# Patient Record
Sex: Female | Born: 1970 | Race: White | Hispanic: No | Marital: Married | Smoking: Former smoker
Health system: Southern US, Community
[De-identification: ages and names within clinical notes are randomized; demographics above are authoritative.]

## PROBLEM LIST (undated history)

## (undated) DIAGNOSIS — B019 Varicella without complication: Secondary | ICD-10-CM

## (undated) DIAGNOSIS — E282 Polycystic ovarian syndrome: Secondary | ICD-10-CM

## (undated) DIAGNOSIS — N390 Urinary tract infection, site not specified: Secondary | ICD-10-CM

## (undated) HISTORY — DX: Varicella without complication: B01.9

## (undated) HISTORY — PX: DILATION AND CURETTAGE OF UTERUS: SHX78

## (undated) HISTORY — PX: TUBAL LIGATION: SHX77

---

## 2011-07-24 ENCOUNTER — Inpatient Hospital Stay (HOSPITAL_COMMUNITY)
Admission: AD | Admit: 2011-07-24 | Discharge: 2011-07-24 | Disposition: A | Discharge status: Home / Self Care | Payer: BC Managed Care – PPO | Source: Ambulatory Visit | Attending: Obstetrics and Gynecology | Admitting: Obstetrics and Gynecology

## 2011-07-24 ENCOUNTER — Encounter (HOSPITAL_COMMUNITY): Payer: Self-pay | Admitting: *Deleted

## 2011-07-24 ENCOUNTER — Inpatient Hospital Stay (HOSPITAL_COMMUNITY): Payer: BC Managed Care – PPO

## 2011-07-24 DIAGNOSIS — N949 Unspecified condition associated with female genital organs and menstrual cycle: Secondary | ICD-10-CM | POA: Insufficient documentation

## 2011-07-24 DIAGNOSIS — M545 Low back pain, unspecified: Secondary | ICD-10-CM | POA: Insufficient documentation

## 2011-07-24 DIAGNOSIS — N83202 Unspecified ovarian cyst, left side: Secondary | ICD-10-CM | POA: Diagnosis present

## 2011-07-24 DIAGNOSIS — N83209 Unspecified ovarian cyst, unspecified side: Secondary | ICD-10-CM

## 2011-07-24 HISTORY — DX: Polycystic ovarian syndrome: E28.2

## 2011-07-24 HISTORY — DX: Urinary tract infection, site not specified: N39.0

## 2011-07-24 LAB — URINALYSIS, ROUTINE W REFLEX MICROSCOPIC
Glucose, UA: NEGATIVE mg/dL
Leukocytes, UA: NEGATIVE
Protein, ur: NEGATIVE mg/dL
Specific Gravity, Urine: 1.01 (ref 1.005–1.030)
pH: 6.5 (ref 5.0–8.0)

## 2011-07-24 LAB — WET PREP, GENITAL: Yeast Wet Prep HPF POC: NONE SEEN

## 2011-07-24 MED ORDER — IBUPROFEN 800 MG PO TABS: 800.0000 mg | ORAL_TABLET | Freq: Three times a day (TID) | ORAL | Status: AC | PRN

## 2011-07-24 MED ORDER — IBUPROFEN 800 MG PO TABS
800.0000 mg | ORAL_TABLET | Freq: Once | ORAL | Status: AC
  Administered 2011-07-24: 800 mg via ORAL
  Filled 2011-07-24: qty 1

## 2011-07-24 NOTE — ED Provider Notes (Signed)
History     No chief complaint on file.  HPI C/O Low back and abd pain since last night. "Comes in waves", not crampy. + nausea and headache. H/O PCOS - regular periods x 10 years. Period 9 days late, had brown spotting around the time of expected period. Has not tried any medicine for the pain - "I didn't want to take anything for it until I came in here". Has appointment for this problem with [facility] tomorrow, has not been seen at that office yet.   OB History    Grav Para Term Preterm Abortions TAB SAB Ect Mult Living   5 3 3  0 2 0 2 0 0 3      Past Medical History  Diagnosis Date  . PCOS (polycystic ovarian syndrome)   . Urinary tract infection     Past Surgical History  Procedure Date  . Cesarean section   . Dilation and curettage of uterus     No family history on file.  History  Substance Use Topics  . Smoking status: Former Smoker    Types: Cigarettes  . Smokeless tobacco: Not on file  . Alcohol Use: No    Allergies:  Allergies  Allergen Reactions  . Penicillins Hives    No prescriptions prior to admission    Review of Systems  Constitutional: Negative.   Respiratory: Negative.   Cardiovascular: Negative.   Gastrointestinal: Positive for abdominal pain. Negative for nausea, vomiting, diarrhea and constipation.  Genitourinary: Negative for dysuria, urgency, frequency, hematuria and flank pain.       Negative for vaginal bleeding  Musculoskeletal: Positive for back pain.  Neurological: Negative.   Psychiatric/Behavioral: Negative.    Physical Exam   Blood pressure 135/71, pulse 75, temperature 97.5 F (36.4 C), temperature source Oral, resp. rate 20, height 5\' 3"  (1.6 m), weight 111.222 kg (245 lb 3.2 oz).  Physical Exam  Constitutional: She appears well-developed and well-nourished. No distress.  Cardiovascular: Normal rate.   Respiratory: Effort normal.  GI: Soft. She exhibits no distension. There is no tenderness.  Genitourinary: Uterus is  tender. Uterus is not enlarged. Cervix exhibits discharge (egg white cervical mucous). Cervix exhibits no friability. Right adnexum displays no mass, no tenderness and no fullness. Left adnexum displays tenderness. Left adnexum displays no mass and no fullness. No tenderness or bleeding around the vagina.    MAU Course  Procedures  Results for orders placed during the hospital encounter of 07/24/11 (from the past 24 hour(s))  URINALYSIS, ROUTINE W REFLEX MICROSCOPIC     Status: Normal   Collection Time   07/24/11  1:03 PM      Component Value Range   Color, Urine YELLOW  YELLOW    Appearance CLEAR  CLEAR    Specific Gravity, Urine 1.010  1.005 - 1.030    pH 6.5  5.0 - 8.0    Glucose, UA NEGATIVE  NEGATIVE (mg/dL)   Hgb urine dipstick NEGATIVE  NEGATIVE    Bilirubin Urine NEGATIVE  NEGATIVE    Ketones, ur NEGATIVE  NEGATIVE (mg/dL)   Protein, ur NEGATIVE  NEGATIVE (mg/dL)   Urobilinogen, UA 0.2  0.0 - 1.0 (mg/dL)   Nitrite NEGATIVE  NEGATIVE    Leukocytes, UA NEGATIVE  NEGATIVE   POCT PREGNANCY, URINE     Status: Normal   Collection Time   07/24/11  1:07 PM      Component Value Range   Preg Test, Ur NEGATIVE      US Transvaginal  Non-ob  07/24/2011  *RADIOLOGY REPORT*  Clinical Data: Pelvic pain  TRANSABDOMINAL AND TRANSVAGINAL ULTRASOUND OF PELVIS Technique:  Both transabdominal and transvaginal ultrasound examinations of the pelvis were performed. Transabdominal technique was performed for global imaging of the pelvis including uterus, ovaries, adnexal regions, and pelvic cul-de-sac.  Comparison: None.   It was necessary to proceed with endovaginal exam following the transabdominal exam to visualize the adnexa.  Findings: The uterus is normal in size and echotexture, measuring 8.5 x 4.5 x 5.3 cm.  The endometrial stripe is homogeneous and within normal limits in width, measuring 8 mm.  Both ovaries have a normal size and appearance.  The right ovary measures 2.0 x 2.2 x 3.3 cm, and the  left ovary measures 3.9 x 2.7 x 2.8 cm.  There is a 2.2 cm simple cyst on the left ovary.  There is no free pelvic fluid.  IMPRESSION: Normal study. No evidence of pelvic mass or other significant abnormality.  Original Report Authenticated By: Brandon Melnick, M.D.   US Pelvis Complete  07/24/2011  *RADIOLOGY REPORT*  Clinical Data: Pelvic pain  TRANSABDOMINAL AND TRANSVAGINAL ULTRASOUND OF PELVIS Technique:  Both transabdominal and transvaginal ultrasound examinations of the pelvis were performed. Transabdominal technique was performed for global imaging of the pelvis including uterus, ovaries, adnexal regions, and pelvic cul-de-sac.  Comparison: None.   It was necessary to proceed with endovaginal exam following the transabdominal exam to visualize the adnexa.  Findings: The uterus is normal in size and echotexture, measuring 8.5 x 4.5 x 5.3 cm.  The endometrial stripe is homogeneous and within normal limits in width, measuring 8 mm.  Both ovaries have a normal size and appearance.  The right ovary measures 2.0 x 2.2 x 3.3 cm, and the left ovary measures 3.9 x 2.7 x 2.8 cm.  There is a 2.2 cm simple cyst on the left ovary.  There is no free pelvic fluid.  IMPRESSION: Normal study. No evidence of pelvic mass or other significant abnormality.  Original Report Authenticated By: Brandon Melnick, M.D.   Assessment and Plan  40 y.o. [id number] with simple left ovarian cyst Ibuprofen for pain PRN F/U as scheduled or sooner with severe pain  Roberta Harper 07/24/2011, 1:19 PM

## 2011-07-24 NOTE — Progress Notes (Signed)
9days late with period. Has been nauseated.  When period was to  Start had a brown discharge.  Did HPT was neg a few days ago.  Back pain and lower abd. started last night, comes in waves. Hx of PCOS.

## 2011-07-24 NOTE — Discharge Instructions (Signed)
Ovarian Cyst An ovarian cyst is a sac filled with fluid or blood. This sac is attached to the ovary. Some cysts go away on their own. Other cysts need treatment. They should be looked at by your doctor.  HOME CARE  Take all medicines as told by your doctor.   Let your doctor know if you had a cyst on your ovary in the past.   Follow up with your doctor as told.   If your doctor is not a women's health specialist (gynecologist), you may need to make an appointment with one. It is important to find the cause of the cyst.  GET HELP IF:  Your periods are late, not regular, or painful.   Your belly (abdomen) or pelvic pain does not go away.   You have pressure on your bladder.   You have pain during sex.   You feel fullness, pressure, or discomfort in your belly (abdomen).   You lose weight for no reason.   You have a temperature by mouth above 102.0.  GET HELP RIGHT AWAY IF:  You develop sudden, very bad pain.   Your belly becomes large or puffy (swollen).   You have a hard time peeing (totally emptying your bladder).   You feel sick most of the time.   You have a temperature by mouth above 102.0, not controlled by medicine.  MAKE SURE YOU:  Understand these instructions.   Will watch your condition.   Will get help right away if you are not doing well or get worse.  Document Released: 05/07/2008 Document Re-Released: 02/13/2010 [facility] Patient Information 2011 [place], [place].

## 2011-07-24 NOTE — ED Provider Notes (Signed)
Chart reviewed and agree with management and plan.  

## 2011-07-25 ENCOUNTER — Other Ambulatory Visit: Payer: Self-pay | Admitting: Obstetrics and Gynecology

## 2011-07-25 DIAGNOSIS — N644 Mastodynia: Secondary | ICD-10-CM

## 2011-07-30 ENCOUNTER — Ambulatory Visit
Admission: RE | Admit: 2011-07-30 | Discharge: 2011-07-30 | Disposition: A | Discharge status: Home / Self Care | Payer: BC Managed Care – PPO | Source: Ambulatory Visit | Attending: Obstetrics and Gynecology | Admitting: Obstetrics and Gynecology

## 2011-07-30 DIAGNOSIS — N644 Mastodynia: Secondary | ICD-10-CM

## 2013-07-13 ENCOUNTER — Other Ambulatory Visit: Payer: Self-pay

## 2013-07-13 DIAGNOSIS — Z1231 Encounter for screening mammogram for malignant neoplasm of breast: Secondary | ICD-10-CM

## 2013-07-24 ENCOUNTER — Ambulatory Visit: Payer: BC Managed Care – PPO

## 2014-06-24 ENCOUNTER — Other Ambulatory Visit (HOSPITAL_COMMUNITY): Payer: Self-pay | Admitting: Obstetrics and Gynecology

## 2014-06-24 DIAGNOSIS — M545 Low back pain, unspecified: Secondary | ICD-10-CM

## 2014-06-24 DIAGNOSIS — R197 Diarrhea, unspecified: Secondary | ICD-10-CM

## 2014-06-25 ENCOUNTER — Ambulatory Visit (HOSPITAL_COMMUNITY)
Admission: RE | Admit: 2014-06-25 | Discharge: 2014-06-25 | Disposition: A | Discharge status: Home / Self Care | Payer: BC Managed Care – PPO | Source: Ambulatory Visit | Attending: Obstetrics and Gynecology | Admitting: Obstetrics and Gynecology

## 2014-06-25 DIAGNOSIS — O9989 Other specified diseases and conditions complicating pregnancy, childbirth and the puerperium: Principal | ICD-10-CM

## 2014-06-25 DIAGNOSIS — O99891 Other specified diseases and conditions complicating pregnancy: Secondary | ICD-10-CM | POA: Insufficient documentation

## 2014-06-25 DIAGNOSIS — M545 Low back pain, unspecified: Secondary | ICD-10-CM | POA: Insufficient documentation

## 2014-06-25 DIAGNOSIS — R197 Diarrhea, unspecified: Secondary | ICD-10-CM

## 2014-07-05 ENCOUNTER — Other Ambulatory Visit: Payer: Self-pay

## 2014-07-06 ENCOUNTER — Other Ambulatory Visit (HOSPITAL_COMMUNITY): Payer: Self-pay | Admitting: Obstetrics and Gynecology

## 2014-07-06 DIAGNOSIS — Z3689 Encounter for other specified antenatal screening: Secondary | ICD-10-CM

## 2014-08-02 ENCOUNTER — Ambulatory Visit (HOSPITAL_COMMUNITY)
Admission: RE | Admit: 2014-08-02 | Discharge: 2014-08-02 | Disposition: A | Discharge status: Home / Self Care | Payer: BC Managed Care – PPO | Source: Ambulatory Visit | Attending: Obstetrics and Gynecology | Admitting: Obstetrics and Gynecology

## 2014-08-02 ENCOUNTER — Encounter (HOSPITAL_COMMUNITY): Payer: Self-pay

## 2014-08-02 DIAGNOSIS — O09529 Supervision of elderly multigravida, unspecified trimester: Secondary | ICD-10-CM | POA: Insufficient documentation

## 2014-08-02 DIAGNOSIS — E669 Obesity, unspecified: Secondary | ICD-10-CM

## 2014-08-02 DIAGNOSIS — Z3689 Encounter for other specified antenatal screening: Secondary | ICD-10-CM | POA: Diagnosis present

## 2014-08-02 DIAGNOSIS — O99210 Obesity complicating pregnancy, unspecified trimester: Secondary | ICD-10-CM

## 2014-08-11 ENCOUNTER — Other Ambulatory Visit (HOSPITAL_COMMUNITY): Payer: Self-pay | Admitting: Obstetrics and Gynecology

## 2014-08-11 DIAGNOSIS — O09529 Supervision of elderly multigravida, unspecified trimester: Secondary | ICD-10-CM

## 2014-08-17 ENCOUNTER — Ambulatory Visit (HOSPITAL_COMMUNITY): Payer: BC Managed Care – PPO

## 2014-08-27 DIAGNOSIS — O34219 Maternal care for unspecified type scar from previous cesarean delivery: Secondary | ICD-10-CM | POA: Insufficient documentation

## 2014-08-27 DIAGNOSIS — O99210 Obesity complicating pregnancy, unspecified trimester: Secondary | ICD-10-CM | POA: Insufficient documentation

## 2014-09-07 DIAGNOSIS — G4733 Obstructive sleep apnea (adult) (pediatric): Secondary | ICD-10-CM | POA: Insufficient documentation

## 2014-10-03 ENCOUNTER — Ambulatory Visit (HOSPITAL_BASED_OUTPATIENT_CLINIC_OR_DEPARTMENT_OTHER): Payer: BC Managed Care – PPO

## 2014-10-04 ENCOUNTER — Encounter (HOSPITAL_COMMUNITY): Payer: Self-pay

## 2014-10-15 DIAGNOSIS — O1415 Severe pre-eclampsia, complicating the puerperium: Secondary | ICD-10-CM | POA: Insufficient documentation

## 2014-11-23 ENCOUNTER — Inpatient Hospital Stay (HOSPITAL_COMMUNITY)
Admission: RE | Admit: 2014-11-23 | Payer: BC Managed Care – PPO | Source: Ambulatory Visit | Admitting: Obstetrics and Gynecology

## 2014-11-23 ENCOUNTER — Encounter (HOSPITAL_COMMUNITY): Admission: RE | Payer: Self-pay | Source: Ambulatory Visit

## 2014-11-23 SURGERY — Surgical Case
Anesthesia: Regional | Laterality: Bilateral

## 2014-12-03 DIAGNOSIS — Z8719 Personal history of other diseases of the digestive system: Secondary | ICD-10-CM | POA: Insufficient documentation

## 2015-07-27 ENCOUNTER — Telehealth: Payer: Self-pay

## 2015-07-28 NOTE — Telephone Encounter (Signed)
Patient called, needs referral to [facility] for gallbladder, went to ER they sent her to general surgeon but wants to go to [facility] instead, she will call back with MD name.dr

## 2015-08-19 HISTORY — PX: CHOLECYSTECTOMY: SHX55

## 2015-09-22 ENCOUNTER — Ambulatory Visit: Payer: Self-pay | Admitting: Internal Medicine

## 2015-09-27 ENCOUNTER — Encounter: Payer: Self-pay | Admitting: Internal Medicine

## 2015-09-27 ENCOUNTER — Encounter (INDEPENDENT_AMBULATORY_CARE_PROVIDER_SITE_OTHER): Payer: Self-pay

## 2015-09-27 ENCOUNTER — Ambulatory Visit (INDEPENDENT_AMBULATORY_CARE_PROVIDER_SITE_OTHER): Payer: Self-pay | Admitting: Internal Medicine

## 2015-09-27 VITALS — BP 108/76 | HR 62 | Temp 98.3°F | Ht 63.25 in | Wt 242.0 lb

## 2015-09-27 DIAGNOSIS — Z23 Encounter for immunization: Secondary | ICD-10-CM

## 2015-09-27 DIAGNOSIS — E282 Polycystic ovarian syndrome: Secondary | ICD-10-CM

## 2015-09-27 DIAGNOSIS — N926 Irregular menstruation, unspecified: Secondary | ICD-10-CM

## 2015-09-27 DIAGNOSIS — Z9049 Acquired absence of other specified parts of digestive tract: Secondary | ICD-10-CM

## 2015-09-27 DIAGNOSIS — L309 Dermatitis, unspecified: Secondary | ICD-10-CM

## 2015-09-27 DIAGNOSIS — K219 Gastro-esophageal reflux disease without esophagitis: Secondary | ICD-10-CM

## 2015-09-27 LAB — CBC
HCT: 39 % (ref 36.0–46.0)
Hemoglobin: 12.5 g/dL (ref 12.0–15.0)
MCHC: 32.1 g/dL (ref 30.0–36.0)
MCV: 81.7 fl (ref 78.0–100.0)
Platelets: 232 10*3/uL (ref 150.0–400.0)
RBC: 4.78 Mil/uL (ref 3.87–5.11)
RDW: 16.5 % — AB (ref 11.5–15.5)
WBC: 7.9 10*3/uL (ref 4.0–10.5)

## 2015-09-27 LAB — COMPREHENSIVE METABOLIC PANEL
ALBUMIN: 4 g/dL (ref 3.5–5.2)
ALT: 335 U/L — ABNORMAL HIGH (ref 0–35)
AST: 135 U/L — AB (ref 0–37)
Alkaline Phosphatase: 171 U/L — ABNORMAL HIGH (ref 39–117)
BUN: 11 mg/dL (ref 6–23)
CHLORIDE: 103 meq/L (ref 96–112)
CO2: 28 meq/L (ref 19–32)
CREATININE: 0.79 mg/dL (ref 0.40–1.20)
Calcium: 9.2 mg/dL (ref 8.4–10.5)
GFR: 84.53 mL/min (ref >60.00)
GLUCOSE: 94 mg/dL (ref 70–99)
POTASSIUM: 3.8 meq/L (ref 3.5–5.1)
SODIUM: 137 meq/L (ref 135–145)
Total Bilirubin: 0.4 mg/dL (ref 0.2–1.2)
Total Protein: 7 g/dL (ref 6.0–8.3)

## 2015-09-27 LAB — POCT URINE PREGNANCY: Preg Test, Ur: NEGATIVE

## 2015-09-27 MED ORDER — OMEPRAZOLE 20 MG PO CPDR: 20.0000 mg | DELAYED_RELEASE_CAPSULE | Freq: Every day | ORAL | Status: DC

## 2015-09-27 MED ORDER — FLUOCINONIDE 0.05 % EX OINT: 1.0000 "application " | TOPICAL_OINTMENT | Freq: Two times a day (BID) | CUTANEOUS | Status: DC

## 2015-09-27 NOTE — Progress Notes (Signed)
Pre visit review using our clinic review tool, if applicable. No additional management support is needed unless otherwise documented below in the visit note. 

## 2015-09-27 NOTE — Progress Notes (Signed)
HPI  Pt presents to the clinic today to establish care and for management of the conditions listed below. She is transferring care from Dr. Judithann GravesBerglund.  PCOS: She has a history of this. She is currently not on any treatment.   She recently underwent a cholecystectomy. About 1 week ago, she started having epigastric pain that was worse after eating and worse when laying down at night. She is having diarrhea after some meals. She denies any history of reflux or heartburn. She has not noticed that certain foods are causing her pain. She denies blood in her stool. She has not tried anything OTC.  She also reports that she is 2 weeks late on her period. Her LMP was 9/19. She did have her tubes tied after her last pregnancy.  Flu: 2015, she would like a flu shot today Tetanus: 2015 Pap Smear: 07/2015 Mammogram: scheduled 09/30/15 Vision Screening: yearly Dentist: biannually  Past Medical History  Diagnosis Date  . PCOS (polycystic ovarian syndrome)   . Urinary tract infection   . Chicken pox     No current outpatient prescriptions on file.   No current facility-administered medications for this visit.    Allergies  Allergen Reactions  . Shellfish Allergy Anaphylaxis  . Iodinated Diagnostic Agents   . Penicillins Hives  . Sulfa Antibiotics     Family History  Problem Relation Age of Onset  . Alcohol abuse Maternal Grandfather   . Lung cancer Maternal Grandfather   . Heart disease Maternal Grandfather   . Stroke Maternal Grandfather   . Arthritis Mother   . Kidney cancer Maternal Aunt   . Hyperlipidemia Maternal Aunt   . Hypertension Maternal Aunt   . Hyperlipidemia Maternal Grandmother   . Hypertension Maternal Grandmother   . Hyperlipidemia Maternal Aunt   . Hypertension Maternal Aunt   . Hyperlipidemia Maternal Aunt     Social History   Social History  . Marital Status: Married    Spouse Name: N/A  . Number of Children: N/A  . Years of Education: N/A    Occupational History  . Not on file.   Social History Main Topics  . Smoking status: Former Smoker    Types: Cigarettes  . Smokeless tobacco: Never Used     Comment: quit 25 years ago  . Alcohol Use: No  . Drug Use: No  . Sexual Activity: Yes    Birth Control/ Protection: None   Other Topics Concern  . Not on file   Social History Narrative    ROS:  Constitutional: Denies fever, malaise, fatigue, headache or abrupt weight changes.  Respiratory: Denies difficulty breathing, shortness of breath, cough or sputum production.   Cardiovascular: Denies chest pain, chest tightness, palpitations or swelling in the hands or feet.  Gastrointestinal: Pt reports epigastric pain and diarrhea. Denies bloating, constipation, or blood in the stool.  GU: Pt reports late period. Denies frequency, urgency, pain with urination, blood in urine, odor or discharge. Skin: She does c/o a rash in her left elbow. Denies lesions or ulcercations.  Neurological: Denies dizziness, difficulty with memory, difficulty with speech or problems with balance and coordination.  Psych: Denies anxiety, depression, SI/HI.  No other specific complaints in a complete review of systems (except as listed in HPI above).  PE:  BP 108/76 mmHg  Pulse 62  Temp(Src) 98.3 F (36.8 C) (Oral)  Ht 5' 3.25" (1.607 m)  Wt 242 lb (109.77 kg)  BMI 42.51 kg/m2  SpO2 98%  LMP 08/22/2015  Breastfeeding?  Unknown Wt Readings from Last 3 Encounters:  09/27/15 242 lb (109.77 kg)  07/24/11 245 lb 3.2 oz (111.222 kg)    General: Appears her stated age, obese in NAD.  Skin: 3 lap sites, well healed. Patch of Eczema noted in left antecubital fossa. Cardiovascular: Normal rate and rhythm. S1,S2 noted.  No murmur, rubs or gallops noted.  Pulmonary/Chest: Normal effort and positive vesicular breath sounds. No respiratory distress. No wheezes, rales or ronchi noted.  Abdomen: Soft and mildly tender in the epigastric region. Normal  bowel sounds. No distention or masses noted.  Neurological: Alert and oriented.  Psychiatric: Mood and affect normal. Behavior is normal. Judgment and thought content normal.     Assessment and Plan:  Missed period:  Urine hcg negative Advised her this could be r/t to recent anesthesia She will continue to monitor  S/p cholecystectomy with reflux:  Will check CBC and CMET Start Prilosec OTC 20 mg daily 30 min before breakfast, RX sent to pharmacy  PCOS:  No treatment needed at this time Will continue to monitor  Need for influenza vaccine:  Flu shot today  Eczema:  eRx for Lidex cream to pharmacy Will have her follow up with derm if does not improve Make an appt for your annual exam

## 2015-09-27 NOTE — Addendum Note (Signed)
Addended by: Roena MaladyEVONTENNO, Aalijah Mims Y on: 09/27/2015 02:35 PM   Modules accepted: Orders

## 2015-09-27 NOTE — Patient Instructions (Signed)
Food Choices for Gastroesophageal Reflux Disease, Adult When you have gastroesophageal reflux disease (GERD), the foods you eat and your eating habits are very important. Choosing the right foods can help ease the discomfort of GERD. WHAT GENERAL GUIDELINES DO I NEED TO FOLLOW?  Choose fruits, vegetables, whole grains, low-fat dairy products, and low-fat meat, fish, and poultry.  Limit fats such as oils, salad dressings, butter, nuts, and avocado.  Keep a food diary to identify foods that cause symptoms.  Avoid foods that cause reflux. These may be different for different people.  Eat frequent small meals instead of three large meals each day.  Eat your meals slowly, in a relaxed setting.  Limit fried foods.  Cook foods using methods other than frying.  Avoid drinking alcohol.  Avoid drinking large amounts of liquids with your meals.  Avoid bending over or lying down until 2-3 hours after eating. WHAT FOODS ARE NOT RECOMMENDED? The following are some foods and drinks that may worsen your symptoms: Vegetables Tomatoes. Tomato juice. Tomato and spaghetti sauce. Chili peppers. Onion and garlic. Horseradish. Fruits Oranges, grapefruit, and lemon (fruit and juice). Meats High-fat meats, fish, and poultry. This includes hot dogs, ribs, ham, sausage, salami, and bacon. Dairy Whole milk and chocolate milk. Sour cream. Cream. Butter. Ice cream. Cream cheese.  Beverages Coffee and tea, with or without caffeine. Carbonated beverages or energy drinks. Condiments Hot sauce. Barbecue sauce.  Sweets/Desserts Chocolate and cocoa. Donuts. Peppermint and spearmint. Fats and Oils High-fat foods, including French fries and potato chips. Other Vinegar. Strong spices, such as black pepper, white pepper, red pepper, cayenne, curry powder, cloves, ginger, and chili powder. The items listed above may not be a complete list of foods and beverages to avoid. Contact your dietitian for more  information.   This information is not intended to replace advice given to you by your health care provider. Make sure you discuss any questions you have with your health care provider.   Document Released: 11/19/2005 Document Revised: 12/10/2014 Document Reviewed: 09/23/2013 Elsevier Interactive Patient Education 2016 [employer] Inc[employer]  

## 2016-01-02 ENCOUNTER — Ambulatory Visit: Payer: Self-pay | Admitting: Internal Medicine

## 2016-01-04 ENCOUNTER — Ambulatory Visit (INDEPENDENT_AMBULATORY_CARE_PROVIDER_SITE_OTHER): Payer: Self-pay | Admitting: Family Medicine

## 2016-01-04 ENCOUNTER — Encounter: Payer: Self-pay | Admitting: Family Medicine

## 2016-01-04 VITALS — BP 110/70 | HR 68 | Temp 98.1°F | Wt 246.2 lb

## 2016-01-04 DIAGNOSIS — R05 Cough: Secondary | ICD-10-CM

## 2016-01-04 DIAGNOSIS — R059 Cough, unspecified: Secondary | ICD-10-CM

## 2016-01-04 MED ORDER — AZITHROMYCIN 250 MG PO TABS
ORAL_TABLET | ORAL | Status: DC
Start: 2016-01-04 — End: 2017-01-24

## 2016-01-04 NOTE — Progress Notes (Signed)
Pre visit review using our clinic review tool, if applicable. No additional management support is needed unless otherwise documented below in the visit note.  Cough started about 3 weeks ago.  Was getting better, then worse again; that cycle has repeated multiple times.  This is atypical for her.  Sick exposures noted.  No FCNAV.  Some days she'll have aches, but hasn't had fevers.  Dry cough usually.  Some scant sputum.  CPAP at night for OSA, difficult to use that recently.    No ST.  Some L ear pain.  No rhinorrhea.  No heartburn.  No wheeze usually, now with some occ scant wheeze.   Tried OTC mucinex, w/o relief.   She had a flu shot.    Meds, vitals, and allergies reviewed.   ROS: See HPI.  Otherwise, noncontributory.  nad ncat Tm wnl B Nasal exam slightly stuffy but no erythema OP with MMM, minimal irritation in posterior OP Neck supple no LA rrr Ctab, no WRR, no inc wob abd soft Ext well perfused.

## 2016-01-04 NOTE — Patient Instructions (Signed)
Your lungs sound clear.  You could still have walking/atypical pneumonia.  Given the duration and your symptoms, I think we should treat you.  Start zithromax, drink plenty of fluids and try to get some rest.  Take care.  Glad to see you.

## 2016-01-05 DIAGNOSIS — R059 Cough, unspecified: Secondary | ICD-10-CM | POA: Insufficient documentation

## 2016-01-05 DIAGNOSIS — R05 Cough: Secondary | ICD-10-CM | POA: Insufficient documentation

## 2016-01-05 NOTE — Assessment & Plan Note (Signed)
Her lungs sound clear.  Could still have walking/atypical pneumonia.  Given the duration of symptoms, I think we should treat her.  Start zithromax, drink plenty of fluids and try to get some rest.  She agrees.  Update me as needed. Nontoxic.

## 2016-01-18 ENCOUNTER — Telehealth: Payer: Self-pay | Admitting: Internal Medicine

## 2016-01-18 NOTE — Telephone Encounter (Signed)
If she is getting worse, then she needs to get checked again.  When can she come in?

## 2016-01-18 NOTE — Telephone Encounter (Signed)
Patient notified as instructed by telephone. Was advised by patient that she feels that the pneumonia has gotten better, but now she has a cold. Patient stated that she has head congestion and earache, but no fever. Patient stated that she feels that another round of antibiotics will get her over this. 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  Pharmacy [facility] [place]

## 2016-01-18 NOTE — Telephone Encounter (Signed)
Patient notified as instructed by telephone and verbalized understanding. 

## 2016-01-18 NOTE — Telephone Encounter (Signed)
Pt called stating she has a cold now with the pneumonia.  She thinks she needs a second round of antibiotics  [place] Sox

## 2016-01-18 NOTE — Telephone Encounter (Signed)
4:15 if needed today.  Thanks.

## 2016-01-18 NOTE — Telephone Encounter (Signed)
If she got better from the prev illness but now has a regular cold, then it is likely viral and she wouldn't need abx.   I would use OTC cough/cold medicine.  If not better and sx persist >7-10 days, then I would get checked out again.  I hope that helps.  Thanks.

## 2016-05-21 ENCOUNTER — Other Ambulatory Visit: Payer: Self-pay | Admitting: Specialist

## 2016-05-21 DIAGNOSIS — Z1231 Encounter for screening mammogram for malignant neoplasm of breast: Secondary | ICD-10-CM

## 2016-06-01 ENCOUNTER — Ambulatory Visit
Admission: RE | Admit: 2016-06-01 | Discharge: 2016-06-01 | Disposition: A | Discharge status: Home / Self Care | Payer: No Typology Code available for payment source | Source: Ambulatory Visit | Attending: Specialist | Admitting: Specialist

## 2016-06-01 DIAGNOSIS — Z1231 Encounter for screening mammogram for malignant neoplasm of breast: Secondary | ICD-10-CM

## 2016-07-01 IMAGING — US US RENAL
1 series · 14 of 25 positions shown · non-contrast
Comparison: None.

CLINICAL DATA: Left-sided low back pain, patient is 18 weeks
pregnant

EXAM:
RENAL/URINARY TRACT ULTRASOUND COMPLETE

[Series 1: us renal · 14 of 43 slices shown]
[im 1/43]
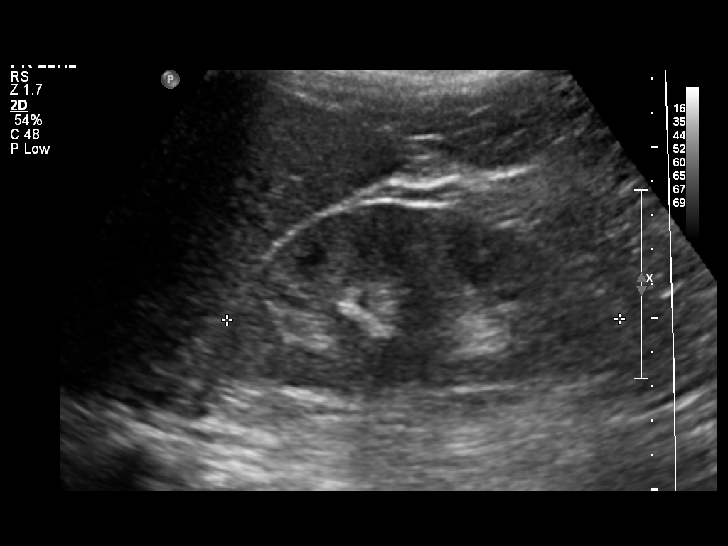
[im 4/43]
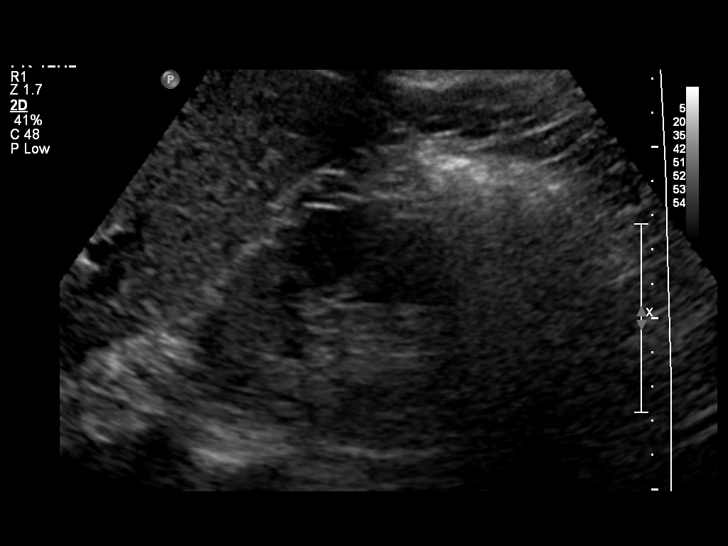
[im 8/43]
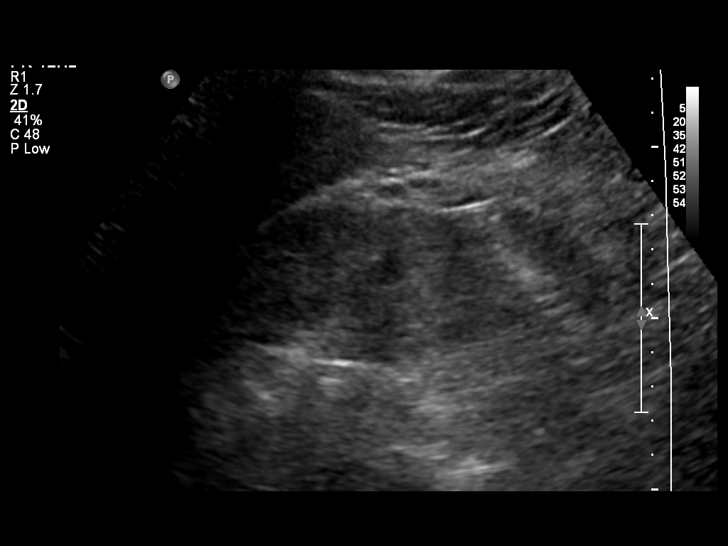
[im 11/43]
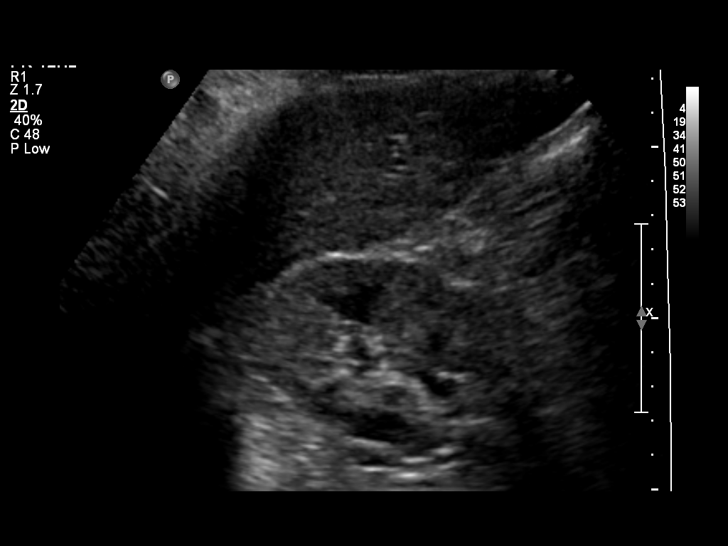
[im 15/43]
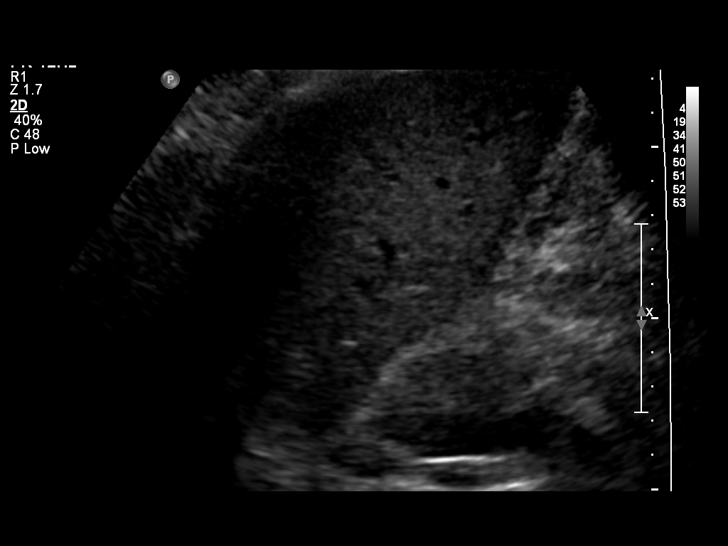
[im 16/43]
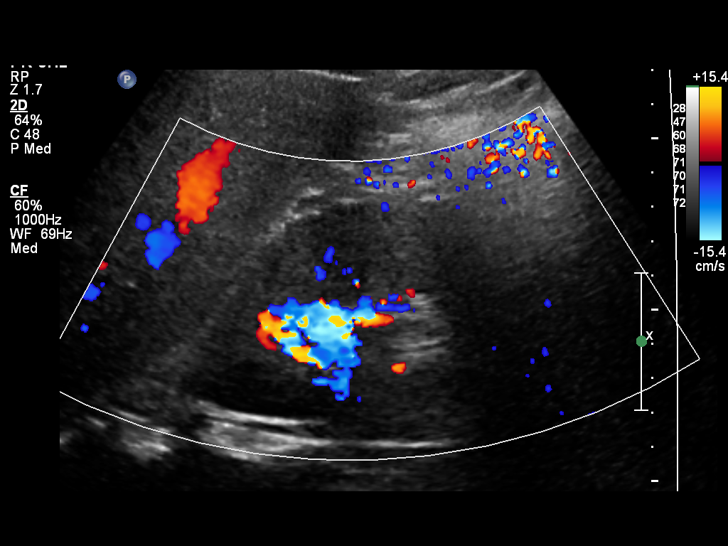
[im 20/43]
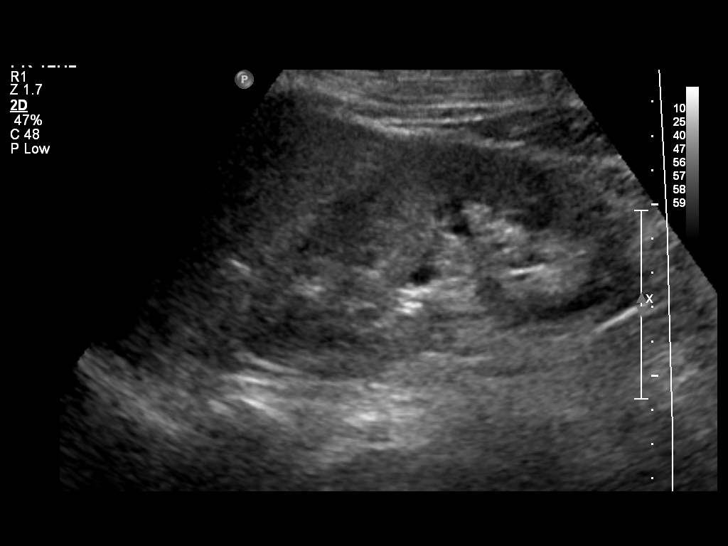
[im 23/43]
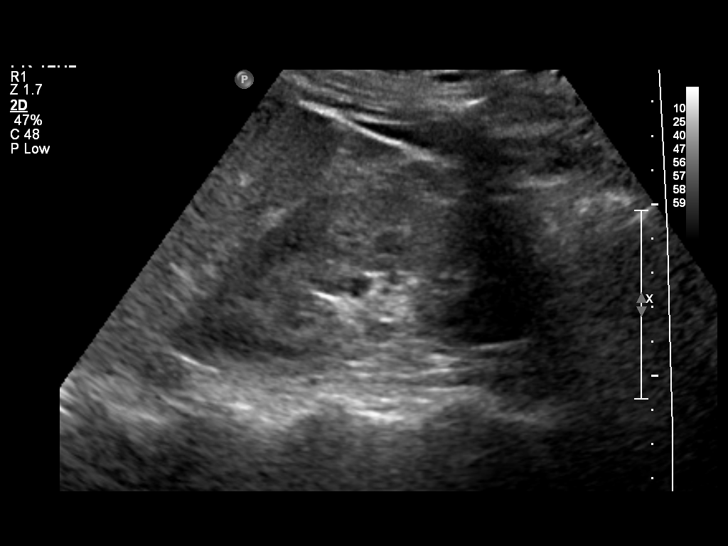
[im 27/43]
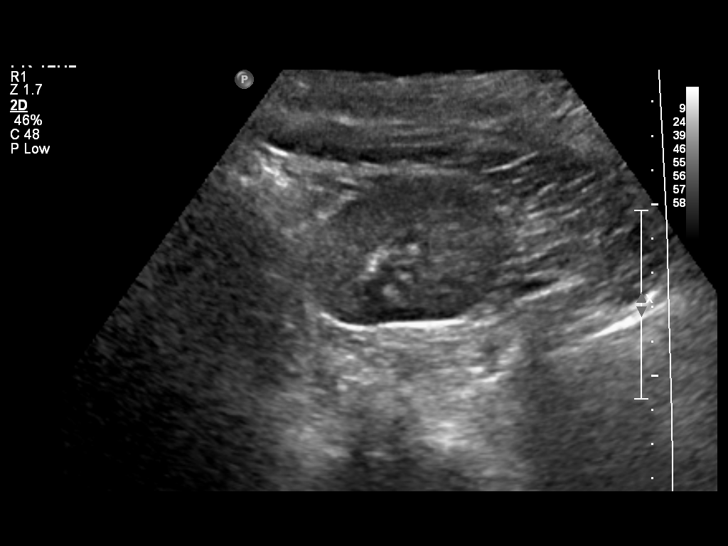
[im 29/43]
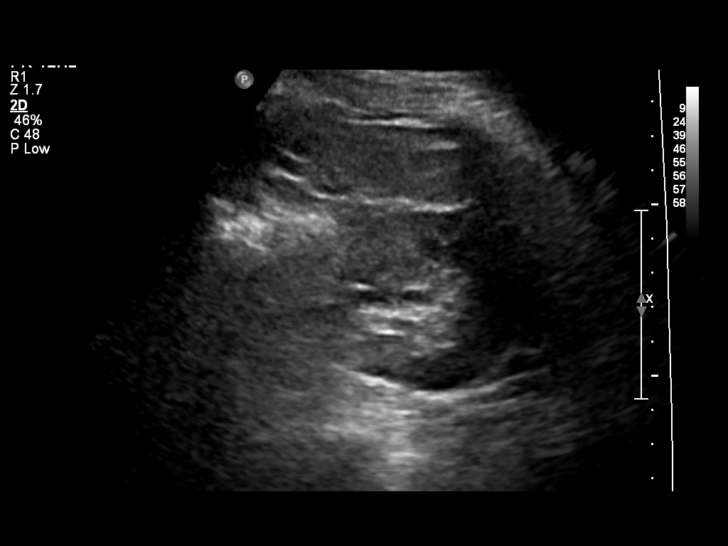
[im 32/43]
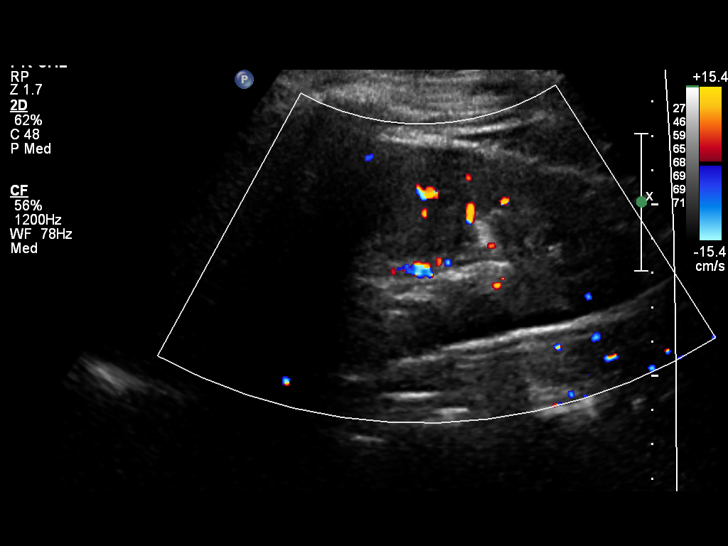
[im 36/43]
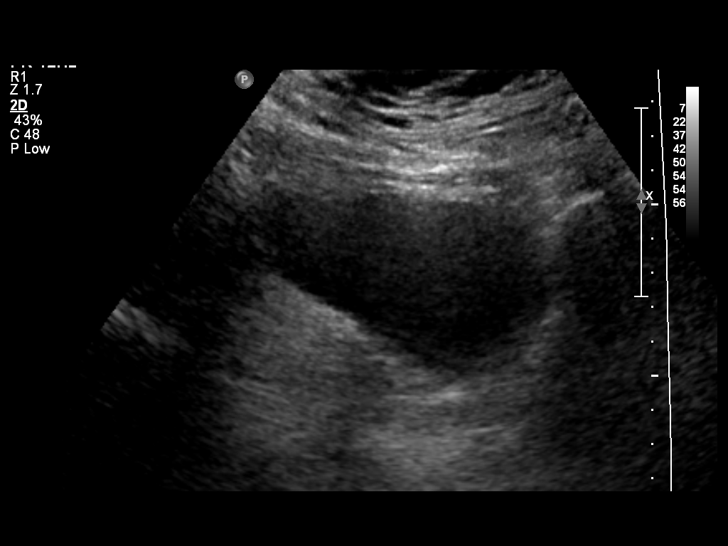
[im 39/43]
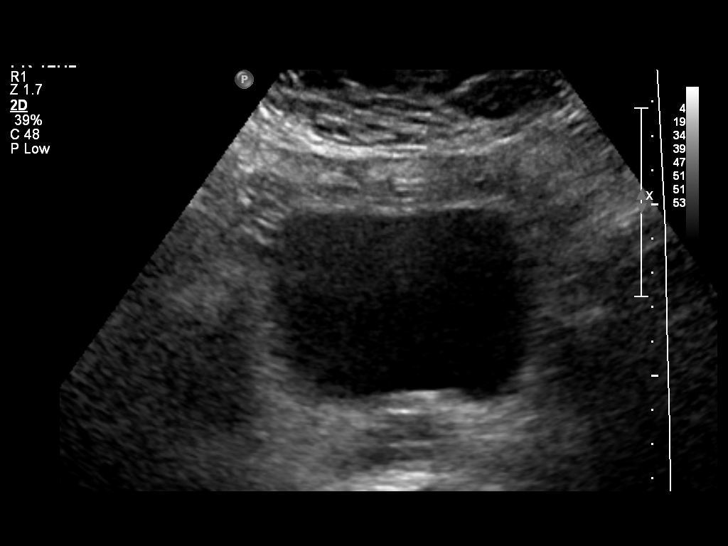
[im 43/43]
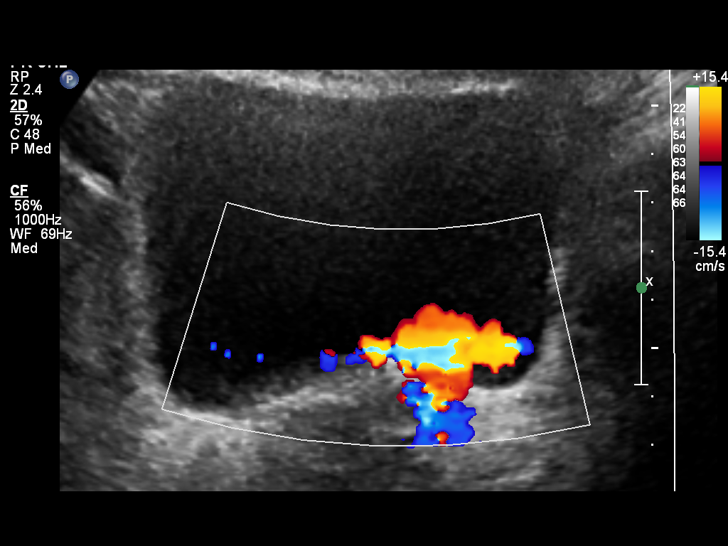

[14 of 25 positions shown; findings below may reference images not displayed]

FINDINGS: Right Kidney:

Length: 11.4 cm. Echogenicity within normal limits. No mass or
hydronephrosis visualized.

Left Kidney:

Length: 11.3 cm. Echogenicity within normal limits. No mass or
hydronephrosis visualized.

Bladder:

Appears normal for degree of bladder distention.
IMPRESSION: Normal renal ultrasound

## 2016-12-04 ENCOUNTER — Ambulatory Visit (INDEPENDENT_AMBULATORY_CARE_PROVIDER_SITE_OTHER): Payer: Self-pay | Admitting: Orthopaedic Surgery

## 2016-12-11 ENCOUNTER — Ambulatory Visit (INDEPENDENT_AMBULATORY_CARE_PROVIDER_SITE_OTHER): Payer: Self-pay | Admitting: Orthopaedic Surgery

## 2017-01-24 ENCOUNTER — Ambulatory Visit (INDEPENDENT_AMBULATORY_CARE_PROVIDER_SITE_OTHER): Payer: Self-pay | Admitting: Internal Medicine

## 2017-01-24 ENCOUNTER — Encounter: Payer: Self-pay | Admitting: Internal Medicine

## 2017-01-24 VITALS — BP 106/64 | HR 67 | Temp 98.1°F | Wt 259.0 lb

## 2017-01-24 DIAGNOSIS — R05 Cough: Secondary | ICD-10-CM

## 2017-01-24 DIAGNOSIS — R059 Cough, unspecified: Secondary | ICD-10-CM

## 2017-01-24 MED ORDER — BENZONATATE 200 MG PO CAPS: 200.0000 mg | ORAL_CAPSULE | Freq: Two times a day (BID) | ORAL | 0 refills | Status: DC | PRN

## 2017-01-24 MED ORDER — HYDROCODONE-HOMATROPINE 5-1.5 MG/5ML PO SYRP: 5.0000 mL | ORAL_SOLUTION | Freq: Three times a day (TID) | ORAL | 0 refills | Status: DC | PRN

## 2017-01-24 NOTE — Patient Instructions (Signed)
Cough, Adult Introduction A cough helps to clear your throat and lungs. A cough may last only 2-3 weeks (acute), or it may last longer than 8 weeks (chronic). Many different things can cause a cough. A cough may be a sign of an illness or another medical condition. Follow these instructions at home:  Pay attention to any changes in your cough.  Take medicines only as told by your doctor.  If you were prescribed an antibiotic medicine, take it as told by your doctor. Do not stop taking it even if you start to feel better.  Talk with your doctor before you try using a cough medicine.  Drink enough fluid to keep your pee (urine) clear or pale yellow.  If the air is dry, use a cold steam vaporizer or humidifier in your home.  Stay away from things that make you cough at work or at home.  If your cough is worse at night, try using extra pillows to raise your head up higher while you sleep.  Do not smoke, and try not to be around smoke. If you need help quitting, ask your doctor.  Do not have caffeine.  Do not drink alcohol.  Rest as needed. Contact a doctor if:  You have new problems (symptoms).  You cough up yellow fluid (pus).  Your cough does not get better after 2-3 weeks, or your cough gets worse.  Medicine does not help your cough and you are not sleeping well.  You have pain that gets worse or pain that is not helped with medicine.  You have a fever.  You are losing weight and you do not know why.  You have night sweats. Get help right away if:  You cough up blood.  You have trouble breathing.  Your heartbeat is very fast. This information is not intended to replace advice given to you by your health care provider. Make sure you discuss any questions you have with your health care provider. Document Released: 08/02/2011 Document Revised: 04/26/2016 Document Reviewed: 01/26/2015  2017 Elsevier  

## 2017-01-24 NOTE — Progress Notes (Signed)
Subjective:    Patient ID: Fernande BoydenJammie Breau, female    DOB: 23-Jun-1971, 46 y.o.   MRN: [id number]  HPI  Pt presents to the clinic today with c/o cough. This started 4 weeks ago. The cough is non productive. She denies runny nose, ear pain, sore throat, shortness of breath, fever, chills or body aches. She has tried essential oils with some relief. She has no history of allergies or breathing problems. She has not had sick contacts.  Review of Systems      Past Medical History:  Diagnosis Date  . Chicken pox   . PCOS (polycystic ovarian syndrome)   . Urinary tract infection     No current outpatient prescriptions on file.   No current facility-administered medications for this visit.     Allergies  Allergen Reactions  . Shellfish Allergy Anaphylaxis  . Iodinated Diagnostic Agents   . Penicillins Hives  . Sulfa Antibiotics     Family History  Problem Relation Age of Onset  . Alcohol abuse Maternal Grandfather   . Lung cancer Maternal Grandfather   . Heart disease Maternal Grandfather   . Stroke Maternal Grandfather   . Arthritis Mother   . Kidney cancer Maternal Aunt   . Hyperlipidemia Maternal Aunt   . Hypertension Maternal Aunt   . Hyperlipidemia Maternal Grandmother   . Hypertension Maternal Grandmother   . Hyperlipidemia Maternal Aunt   . Hypertension Maternal Aunt   . Hyperlipidemia Maternal Aunt     Social History   Social History  . Marital status: Married    Spouse name: N/A  . Number of children: N/A  . Years of education: N/A   Occupational History  . Not on file.   Social History Main Topics  . Smoking status: Former Smoker    Types: Cigarettes  . Smokeless tobacco: Never Used     Comment: quit 25 years ago  . Alcohol use No  . Drug use: No  . Sexual activity: Yes    Birth control/ protection: None   Other Topics Concern  . Not on file   Social History Narrative  . No narrative on file     Constitutional: Denies fever, malaise,  fatigue, headache or abrupt weight changes.  HEENT: Denies eye pain, eye redness, ear pain, ringing in the ears, wax buildup, runny nose, nasal congestion, bloody nose, or sore throat. Respiratory: Pt reports cough. Denies difficulty breathing, shortness of breath, or sputum production.     No other specific complaints in a complete review of systems (except as listed in HPI above).  Objective:   Physical Exam   BP 106/64   Pulse 67   Temp 98.1 F (36.7 C) (Oral)   Wt 259 lb (117.5 kg)   SpO2 99%   BMI 45.52 kg/m  Wt Readings from Last 3 Encounters:  01/24/17 259 lb (117.5 kg)  01/04/16 246 lb 4 oz (111.7 kg)  09/27/15 242 lb (109.8 kg)    General: Appears her stated age, obese in NAD. HEENT: Head: normal shape and size; Throat/Mouth: Teeth present, mucosa pink and moist, no exudate, lesions or ulcerations noted.  Neck:  No adenopathy noted. Cardiovascular: Normal rate and rhythm.  Pulmonary/Chest: Normal effort and positive vesicular breath sounds. No respiratory distress. No wheezes, rales or ronchi noted.   BMET    Component Value Date/Time   NA 137 09/27/2015 1034   K 3.8 09/27/2015 1034   CL 103 09/27/2015 1034   CO2 28 09/27/2015 1034  GLUCOSE 94 09/27/2015 1034   BUN 11 09/27/2015 1034   CREATININE 0.79 09/27/2015 1034   CALCIUM 9.2 09/27/2015 1034    Lipid Panel  No results found for: CHOL, TRIG, HDL, CHOLHDL, VLDL, LDLCALC  CBC    Component Value Date/Time   WBC 7.9 09/27/2015 1034   RBC 4.78 09/27/2015 1034   HGB 12.5 09/27/2015 1034   HCT 39.0 09/27/2015 1034   PLT 232.0 09/27/2015 1034   MCV 81.7 09/27/2015 1034   MCHC 32.1 09/27/2015 1034   RDW 16.5 (H) 09/27/2015 1034    Hgb A1C No results found for: HGBA1C         Assessment & Plan:   Cough:  Viral vs allergy Start Zyrtec OTC daily x 2 weeks eRx for Tessalon TID prn RX for Hycodan cough syrup at night  RTC as needed or if symptoms persist or worsen Ellah Otte, NP

## 2018-01-20 ENCOUNTER — Telehealth: Payer: Self-pay | Admitting: Internal Medicine

## 2018-01-20 NOTE — Telephone Encounter (Signed)
Pt does not understand why she needs to come in. Pt states she has a cold, no real flu symptoms. Pt states she has been exposed , has 4 children and prefers to be proactive.  [facility], advised pt call her pcp to get the tami flu.  Pt would appreciate a Rx.  Pt states Dr Alphonsus SiasLetvak sees her children and advised he would call in if they were symptomatic.  [facility] [phone] - [place] - [address] [facility] [phone] (Phone) [phone] (Fax)

## 2018-01-20 NOTE — Telephone Encounter (Signed)
Pt last seen 01/24/17.Please advise.

## 2018-01-20 NOTE — Telephone Encounter (Signed)
Because she is having symptoms, she needs to be seen.

## 2018-01-20 NOTE — Telephone Encounter (Deleted)
If she is having symptoms, she needs to be tested. The dose of Tamiflu differs for prophylaxis vs treatment.

## 2018-01-20 NOTE — Telephone Encounter (Signed)
If she is having symptoms, she needs to be tested. The dose of Tamiflu differs for prophylaxis vs treatment. 

## 2018-01-20 NOTE — Telephone Encounter (Signed)
Copied from CRM [phone]. Topic: Quick Communication - See Telephone Encounter >> Jan 20, 2018 11:21 AM Terisa Starraylor, Brittany L wrote: CRM for notification. See Telephone encounter for:   01/20/18.  Pt said she has been around her Mother in Law that was diagnosed over the weekend with the flu. She wants to know if she may have a script for tami-flu because is starting to have the symptoms of flu.  [facility] [phone] - [place], [place] - [address] AT [facility] [address] [facility]

## 2018-01-20 NOTE — Telephone Encounter (Signed)
Left message on voicemail.

## 2018-01-21 NOTE — Telephone Encounter (Signed)
Pt has appt for tomorrow at 2pm 

## 2018-01-22 ENCOUNTER — Ambulatory Visit: Payer: No Typology Code available for payment source | Admitting: Internal Medicine

## 2018-01-22 DIAGNOSIS — Z0289 Encounter for other administrative examinations: Secondary | ICD-10-CM

## 2018-03-11 ENCOUNTER — Other Ambulatory Visit: Payer: Self-pay | Admitting: Oncology

## 2018-03-11 ENCOUNTER — Other Ambulatory Visit: Payer: Self-pay | Admitting: Specialist

## 2018-03-11 DIAGNOSIS — Z1231 Encounter for screening mammogram for malignant neoplasm of breast: Secondary | ICD-10-CM

## 2018-03-12 ENCOUNTER — Other Ambulatory Visit: Payer: Self-pay | Admitting: Specialist

## 2018-03-12 DIAGNOSIS — N644 Mastodynia: Secondary | ICD-10-CM

## 2018-03-17 ENCOUNTER — Ambulatory Visit
Admission: RE | Admit: 2018-03-17 | Discharge: 2018-03-17 | Disposition: A | Discharge status: Home / Self Care | Payer: No Typology Code available for payment source | Source: Ambulatory Visit

## 2018-03-17 ENCOUNTER — Ambulatory Visit: Payer: No Typology Code available for payment source

## 2018-03-17 DIAGNOSIS — N644 Mastodynia: Secondary | ICD-10-CM

## 2018-03-25 ENCOUNTER — Other Ambulatory Visit: Payer: Self-pay

## 2018-03-25 DIAGNOSIS — T50905A Adverse effect of unspecified drugs, medicaments and biological substances, initial encounter: Secondary | ICD-10-CM | POA: Insufficient documentation

## 2018-03-25 DIAGNOSIS — N644 Mastodynia: Secondary | ICD-10-CM | POA: Insufficient documentation

## 2018-03-25 DIAGNOSIS — N915 Oligomenorrhea, unspecified: Secondary | ICD-10-CM | POA: Insufficient documentation

## 2018-03-25 DIAGNOSIS — N912 Amenorrhea, unspecified: Secondary | ICD-10-CM | POA: Insufficient documentation

## 2018-03-25 DIAGNOSIS — N946 Dysmenorrhea, unspecified: Secondary | ICD-10-CM | POA: Insufficient documentation

## 2018-03-25 DIAGNOSIS — N920 Excessive and frequent menstruation with regular cycle: Secondary | ICD-10-CM | POA: Insufficient documentation

## 2018-03-27 ENCOUNTER — Encounter: Payer: Self-pay | Admitting: Surgery

## 2018-03-27 ENCOUNTER — Ambulatory Visit: Payer: Self-pay | Admitting: Surgery

## 2018-03-27 VITALS — BP 110/71 | HR 67 | Temp 98.1°F | Resp 14 | Ht 63.0 in | Wt 259.0 lb

## 2018-03-27 DIAGNOSIS — R1903 Right lower quadrant abdominal swelling, mass and lump: Secondary | ICD-10-CM

## 2018-03-27 NOTE — Patient Instructions (Addendum)
You are scheduled for an abdominal ultrasound at [facility] on 04/01/2018 at 10:15 am. You will need to arrive by 10:00 am and have nothing to eat or drink after midnight the night before.  [facility] is located just off [address] at [address], [place], [place] [address]  You will have a follow up appointment with Dr Earlene Plateravis here on 04/07/18 at 11:30 am..

## 2018-03-27 NOTE — Progress Notes (Signed)
Surgical Clinic History and Physical  Referring provider:  Lorre MunroeBaity, Regina W, NP [address] [place], [place] [address]  HISTORY OF PRESENT ILLNESS (HPI):  47 y.o. female presents for evaluation of RLQ abdominal wall mass. Patient reports she first noticed it was "about the size of a golfball" 1 year ago and attributed to her prior c-sections x4, but more recently she's noticed that it's grown larger to the size of a grapefruit, though she only feels it when she stands and says it seems to disappear when she lays down. She denies any pain or symptoms otherwise associated with the mass, but self-scheduled evaluation of the mass after she had a good experience bringing a family member to our office and decided she should have her enlarging RLQ abdominal wall mass evaluated. She otherwise denies any recent fever/chills, weight loss or gain, history of lipomas/sebaceous cysts or malignancies.  PAST MEDICAL HISTORY (PMH):  Past Medical History:  Diagnosis Date  . Chicken pox   . PCOS (polycystic ovarian syndrome)   . Urinary tract infection      PAST SURGICAL HISTORY (PSH):  Past Surgical History:  Procedure Laterality Date  . CESAREAN SECTION     4  . CHOLECYSTECTOMY  08/19/2015  . DILATION AND CURETTAGE OF UTERUS    . TUBAL LIGATION       MEDICATIONS:  Prior to Admission medications   Not on File     ALLERGIES:  Allergies  Allergen Reactions  . Shellfish Allergy Anaphylaxis  . Iodinated Diagnostic Agents   . Penicillins Hives  . Sulfa Antibiotics      SOCIAL HISTORY:  Social History   Socioeconomic History  . Marital status: Married    Spouse name: Not on file  . Number of children: Not on file  . Years of education: Not on file  . Highest education level: Not on file  Occupational History  . Not on file  Social Needs  . Financial resource strain: Not on file  . Food insecurity:    Worry: Not on file    Inability: Not on file  . Transportation needs:     Medical: Not on file    Non-medical: Not on file  Tobacco Use  . Smoking status: Former Smoker    Types: Cigarettes  . Smokeless tobacco: Never Used  . Tobacco comment: quit 25 years ago  Substance and Sexual Activity  . Alcohol use: No    Alcohol/week: 0.0 oz  . Drug use: No  . Sexual activity: Yes    Birth control/protection: None  Lifestyle  . Physical activity:    Days per week: Not on file    Minutes per session: Not on file  . Stress: Not on file  Relationships  . Social connections:    Talks on phone: Not on file    Gets together: Not on file    Attends religious service: Not on file    Active member of club or organization: Not on file    Attends meetings of clubs or organizations: Not on file    Relationship status: Not on file  . Intimate partner violence:    Fear of current or ex partner: Not on file    Emotionally abused: Not on file    Physically abused: Not on file    Forced sexual activity: Not on file  Other Topics Concern  . Not on file  Social History Narrative  . Not on file    The patient currently resides (  home / rehab facility / nursing home): Home The patient normally is (ambulatory / bedbound): Ambulatory  FAMILY HISTORY:  Family History  Problem Relation Age of Onset  . Arthritis Mother   . Alcohol abuse Maternal Grandfather   . Lung cancer Maternal Grandfather   . Heart disease Maternal Grandfather   . Stroke Maternal Grandfather   . Kidney cancer Maternal Aunt   . Hyperlipidemia Maternal Aunt   . Hypertension Maternal Aunt   . Hyperlipidemia Maternal Grandmother   . Hypertension Maternal Grandmother   . Hyperlipidemia Maternal Aunt   . Hypertension Maternal Aunt   . Hyperlipidemia Maternal Aunt     Otherwise negative/non-contributory.  REVIEW OF SYSTEMS:  Constitutional: denies any other weight loss, fever, chills, or sweats  Eyes: denies any other vision changes, history of eye injury  ENT: denies sore throat, hearing problems   Respiratory: denies shortness of breath, wheezing  Cardiovascular: denies chest pain, palpitations  Gastrointestinal: denies abdominal pain, N/V, or diarrhea Musculoskeletal: denies any other joint pains or cramps  Skin: Denies any other rashes or skin discolorations Neurological: denies any other headache, dizziness, weakness  Psychiatric: Denies any other depression, anxiety   All other review of systems were otherwise negative   VITAL SIGNS:  BP 110/71   Pulse 67   Temp 98.1 F (36.7 C)   Resp 14   Ht 5\' 3"  (1.6 m)   Wt 259 lb (117.5 kg)   LMP 02/27/2018 (Exact Date)   BMI 45.88 kg/m   PHYSICAL EXAM:  Constitutional:  -- Obese body habitus  -- Awake, alert, and oriented x3  Eyes:  -- Pupils equally round and reactive to light  -- No scleral icterus  Ear, nose, throat:  -- No jugular venous distension -- No nasal drainage, bleeding Pulmonary:  -- No crackles  -- Equal breath sounds bilaterally -- Breathing non-labored at rest Cardiovascular:  -- S1, S2 present  -- No pericardial rubs  Gastrointestinal:  -- Abdomen soft, nontender, non-distended, no guarding/rebound  -- No abdominal masses appreciated, pulsatile or otherwise, though potentially obscured by body habitus  Musculoskeletal and Integumentary:  -- Wounds or skin discoloration: None appreciated -- Extremities: B/L UE and LE FROM, hands and feet warm, no edema  Neurologic:  -- Motor function: Intact and symmetric -- Sensation: Intact and symmetric  Labs:  CBC Latest Ref Rng & Units 09/27/2015  WBC 4.0 - 10.5 K/uL 7.9  Hemoglobin 12.0 - 15.0 g/dL 16.1  Hematocrit 09.6 - 46.0 % 39.0  Platelets 150.0 - 400.0 K/uL 232.0   CMP Latest Ref Rng & Units 09/27/2015  Glucose 70 - 99 mg/dL 94  BUN 6 - 23 mg/dL 11  Creatinine 0.45 - 4.09 mg/dL 8.11  Sodium 914 - 782 mEq/L 137  Potassium 3.5 - 5.1 mEq/L 3.8  Chloride 96 - 112 mEq/L 103  CO2 19 - 32 mEq/L 28  Calcium 8.4 - 10.5 mg/dL 9.2  Total Protein  6.0 - 8.3 g/dL 7.0  Total Bilirubin 0.2 - 1.2 mg/dL 0.4  Alkaline Phos 39 - 117 U/L 171(H)  AST 0 - 37 U/L 135(H)  ALT 0 - 35 U/L 335(H)    Imaging studies: No pertinent imaging studies available to review   Assessment/Plan:  47 y.o. female with reported enlarging RLQ abdominal wall mass, complicated by co-morbidities including obesity (BMI 46) and polycystic ovarian syndrome.   - differential diagnoses were discussed along with risks, benefits, and alternatives to observation without intervention or imaging vs CT imaging vs ultrasound  imaging  - will proceed with RLQ abdominal wall ultrasound, though may proceed to CT if non-visualization  - return to clinic following ultrasound, instructed to call if any questions or concerns  All of the above recommendations were discussed with the patient and her mom, and all of patient's and family's questions were answered to their expressed satisfaction.  Thank you for the opportunity to participate in this patient's care.  -- Scherrie Gerlach Earlene Plater, MD, RPVI [place]: [facility] General [facility] - Partnering for exceptional care. Office: [phone]

## 2018-04-01 ENCOUNTER — Ambulatory Visit
Admission: RE | Admit: 2018-04-01 | Discharge: 2018-04-01 | Disposition: A | Discharge status: Home / Self Care | Payer: No Typology Code available for payment source | Source: Ambulatory Visit | Attending: Surgery | Admitting: Surgery

## 2018-04-01 DIAGNOSIS — R1903 Right lower quadrant abdominal swelling, mass and lump: Secondary | ICD-10-CM | POA: Insufficient documentation

## 2018-04-07 ENCOUNTER — Ambulatory Visit: Payer: Self-pay | Admitting: Surgery

## 2018-04-07 ENCOUNTER — Encounter: Payer: Self-pay | Admitting: Surgery

## 2018-04-07 DIAGNOSIS — R1900 Intra-abdominal and pelvic swelling, mass and lump, unspecified site: Secondary | ICD-10-CM

## 2018-04-07 NOTE — Progress Notes (Signed)
Surgical Clinic Progress/Follow-up Note   HPI:  47 y.o. Female presents to clinic for follow-up evaluation of RLQ abdominal wall mass. Patient continues to deny any associated pain, but feels certain there is an enlarged mass of which she'd like to know the etiology while she is able to get time away from her work and family responsibilities to have it evaluated. Patient otherwise reports routine +flatus and daily BM's, denies any N/V, fever/chills, weight loss, CP, or SOB.  Review of Systems:  Constitutional: denies any other weight loss, fever, chills, or sweats  Eyes: denies any other vision changes, history of eye injury  ENT: denies sore throat, hearing problems  Respiratory: denies shortness of breath, wheezing  Cardiovascular: denies chest pain, palpitations  Gastrointestinal: denies abdominal pain, N/V, or diarrhea Musculoskeletal: denies any other joint pains or cramps  Skin: Denies any other rashes or skin discolorations  Neurological: denies any other headache, dizziness, weakness  Psychiatric: denies any other depression, anxiety  All other review of systems: otherwise negative   Vital Signs:  BP 118/73   Pulse 83   Temp 98.3 F (36.8 C) (Oral)   Ht  (1.6 m)   Wt 259 lb (117.5 kg)   BMI 45.88 kg/m    Physical Exam:  Constitutional:  -- Obese body habitus  -- Awake, alert, and oriented x3  Eyes:  -- Pupils equally round and reactive to light  -- No scleral icterus  Ear, nose, throat:  -- No jugular venous distension  -- No nasal drainage, bleeding Pulmonary:  -- No crackles -- Equal breath sounds bilaterally -- Breathing non-labored at rest Cardiovascular:  -- S1, S2 present  -- No pericardial rubs  Gastrointestinal:  -- Soft, nontender, non-distended, no guarding/rebound  -- No abdominal masses appreciated, pulsatile or otherwise, though potentially obscured by body habitus Musculoskeletal / Integumentary:  -- Wounds or skin discoloration: None  appreciated  -- Extremities: B/L UE and LE FROM, hands and feet warm, no edema  Neurologic:  -- Motor function: intact and symmetric  -- Sensation: intact and symmetric  Imaging: Abdominal Wall Ultrasound (04/01/2018) In the area of clinical concern at the site of palpable abnormality along the right lower quadrant abdominal wall, there is no soft tissue mass or fluid collection. No evidence of hernia.  Assessment:  47 y.o. yo Female with a problem list including...  Patient Active Problem List   Diagnosis Date Noted  . Menorrhagia 03/25/2018  . Dysmenorrhea 03/25/2018  . Amenorrhea 03/25/2018  . Pain of breast 03/25/2018  . Oligomenorrhea 03/25/2018  . Adverse reaction to drug 03/25/2018  . History of gallstones 12/03/2014  . Hypertension in pregnancy, preeclampsia, severe, delivered/postpartum 10/15/2014  . Obstructive sleep apnea of adult 09/07/2014  . Previous cesarean delivery affecting pregnancy, antepartum 08/27/2014  . Obesity affecting pregnancy, antepartum 08/27/2014  . Left ovarian cyst 07/24/2011    presents to clinic for follow-up evaluation ofreported enlarging RLQ abdominal wall mass, complicated by co-morbidities including obesity (BMI 46) and polycystic ovarian syndrome.  Plan:   - ultrasound results discussed with patient   - considering non-visualization on ultrasound and likelihood that CT may not visualize a lipoma or similar abdominal wall mass, will proceed with MRI (patient also reports a shellfish/iodine allergy, though she could be pre-medicated if CT needed)  - return to clinic following MRI, instructed to call office if any questions or concerns  All of the above recommendations were discussed with the patient and patient's family, and all of patient's and family's questions  were answered to their expressed satisfaction.  -- Scherrie Gerlach Earlene Plater, MD, RPVI [place]: [facility] - Partnering for exceptional  care. Office: [phone]

## 2018-04-07 NOTE — Patient Instructions (Signed)
We will call you once we have the MRI scheduled.  Please give [place] a call in case you have any questions or concerns.

## 2018-04-11 ENCOUNTER — Ambulatory Visit: Admission: RE | Admit: 2018-04-11 | Payer: No Typology Code available for payment source | Source: Ambulatory Visit

## 2018-04-16 ENCOUNTER — Telehealth: Payer: Self-pay

## 2018-04-16 NOTE — Telephone Encounter (Signed)
Contacted patient to ask why she had cancelled her MRI and she stated that she was dealing with some issues with her son and she stated that he comes first. Once his health issues were resolved, she would reschedule her MRI and let us know so she could come back to see Dr. Earlene Plater and discuss results and what would need to be done if anything. I told her to call us back in case she had questions. She agreed.

## 2018-05-21 ENCOUNTER — Telehealth: Payer: Self-pay | Admitting: Family Medicine

## 2018-05-21 MED ORDER — AMOXICILLIN 500 MG PO CAPS: 1000.0000 mg | ORAL_CAPSULE | Freq: Two times a day (BID) | ORAL | 0 refills | Status: DC

## 2018-05-21 NOTE — Telephone Encounter (Signed)
amox 500 mg, 2 tabs po bid, #40

## 2018-05-21 NOTE — Telephone Encounter (Signed)
Amoxicillin 500 mg prescription sent to [facility] [place].  Leeah notified by telephone.

## 2018-05-21 NOTE — Telephone Encounter (Signed)
Copied from CRM [phone]. Topic: Quick Communication - See Telephone Encounter >> May 21, 2018 10:09 AM Raquel SarnaHayes, Teresa G wrote: Pt's son's - Franky MachoLuke 04-01-08 and Renae Fickleaul 10-20-00 came in on Mon 17th with strep.  They were tested positive.   Pt was told by Dr. Patsy Lageropland if she or any other family members were feeling the same symptoms, he would call in a Rx for them. Pt is needing a Rx for the symptoms.

## 2018-05-22 ENCOUNTER — Telehealth: Payer: Self-pay | Admitting: Internal Medicine

## 2018-05-22 MED ORDER — AZITHROMYCIN 250 MG PO TABS: ORAL_TABLET | ORAL | 0 refills | Status: AC

## 2018-05-22 NOTE — Telephone Encounter (Signed)
Nicolet notified by telephone that Dr. Patsy Lageropland has [facility] in [facility] [place].

## 2018-05-22 NOTE — Telephone Encounter (Signed)
My apologies  zpak  2 tabs po on day 1, then 1 tab po for 4 days

## 2018-05-22 NOTE — Telephone Encounter (Signed)
Patient is allergic to Penicillin.  Can you please send her in a different antibiotic.

## 2018-05-22 NOTE — Telephone Encounter (Signed)
Medication change request - allergy to  Penicillin - has picked  rx up but not taken any yet  Pt  is aware and will not take   Patient states has had rash in past to penicillin    Rx  Written on 05/21/2018 Dr Karleen HampshireSpencer Copland   Amox   500 mg caps 2 caps bid  Qty 40   See phone note 05/21/2018   Pt c/o sorethroat    Pharmacy [facility]  [address] [place]

## 2018-05-22 NOTE — Telephone Encounter (Signed)
Copied from CRM [phone]#[id number]. Topic: Quick Communication - Rx Refill/Question >> May 22, 2018  8:21 AM Cipriano BunkerLambe, Annette S wrote: Medication:  amoxicillin (AMOXIL) 500 MG capsule  She has a penicillin allergy Will need different prescription called in.   Has the patient contacted their pharmacy? Yes.   (Agent: If no, request that the patient contact the pharmacy for the refill.) (Agent: If yes, when and what did the pharmacy advise?)  Preferred Pharmacy (with phone number or street name):  [facility] [phone] - [place] - [address] [facility] [address] [facility] [address] [place] [address] Phone: [phone] Fax: [phone]    Agent: Please be advised that RX refills may take up to 3 business days. We ask that you follow-up with your pharmacy.

## 2022-02-12 ENCOUNTER — Other Ambulatory Visit: Payer: Self-pay | Admitting: Emergency Medicine

## 2022-02-12 DIAGNOSIS — Z1231 Encounter for screening mammogram for malignant neoplasm of breast: Secondary | ICD-10-CM

## 2022-02-19 ENCOUNTER — Ambulatory Visit
Admission: RE | Admit: 2022-02-19 | Discharge: 2022-02-19 | Disposition: A | Discharge status: Home / Self Care | Payer: 59 | Source: Ambulatory Visit | Attending: Emergency Medicine | Admitting: Emergency Medicine

## 2022-02-19 DIAGNOSIS — Z1231 Encounter for screening mammogram for malignant neoplasm of breast: Secondary | ICD-10-CM

## 2024-02-26 IMAGING — MG MM DIGITAL SCREENING BILAT W/ TOMO AND CAD
8 of 14 series · 8 of 40 positions shown · non-contrast
Comparison: Previous exam(s).

ACR Breast Density Category a: The breast tissue is almost entirely
fatty.

CLINICAL DATA: Screening.

EXAM:
DIGITAL SCREENING BILATERAL MAMMOGRAM WITH TOMOSYNTHESIS AND CAD
TECHNIQUE: Bilateral screening digital craniocaudal and mediolateral oblique
mammograms were obtained. Bilateral screening digital breast
tomosynthesis was performed. The images were evaluated with
computer-aided detection.

[R CC synth-2D]
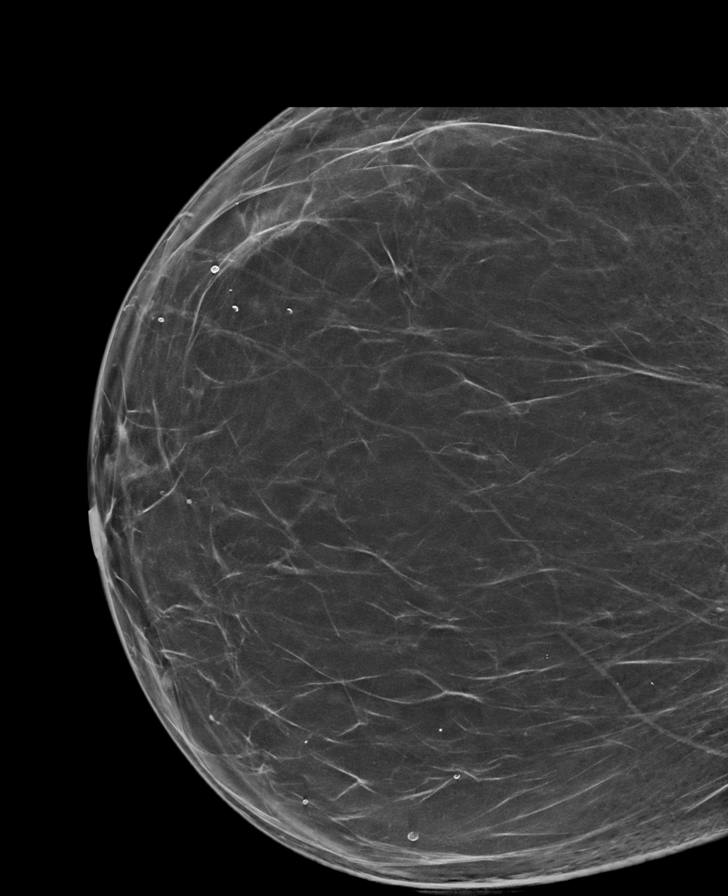

[L CV synth-2D]
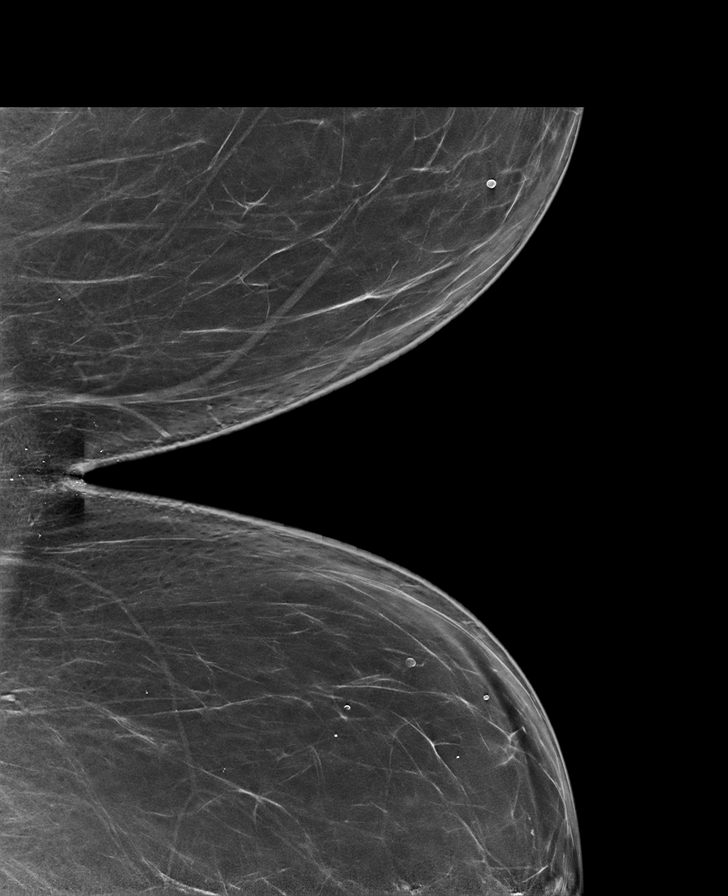

[L MLO synth-2D (1 of 2)]
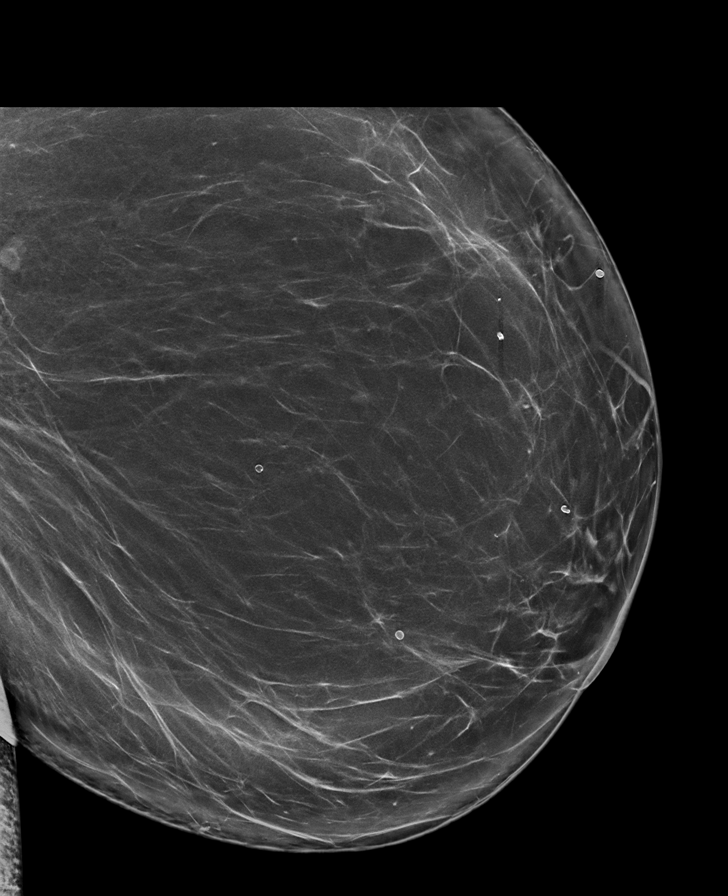

[L CC synth-2D]
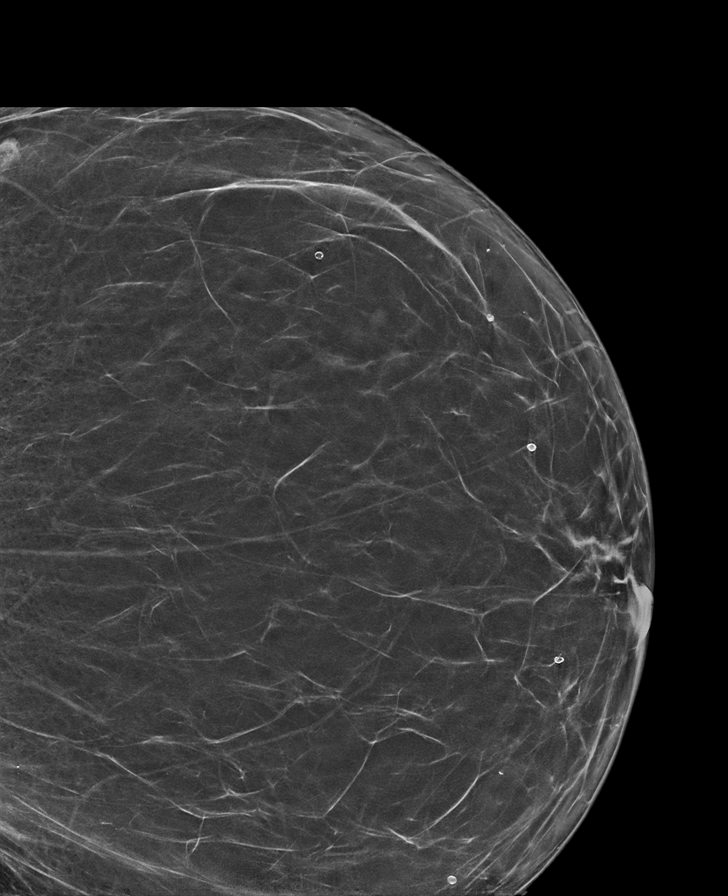

[R MLO synth-2D (1 of 2)]
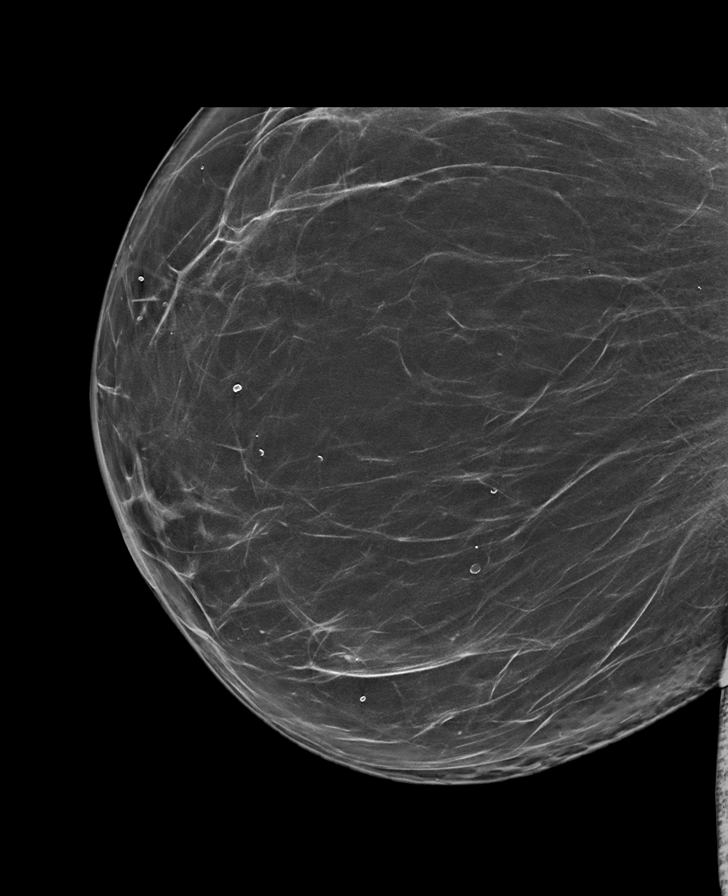

[R MLO synth-2D (2 of 2)]
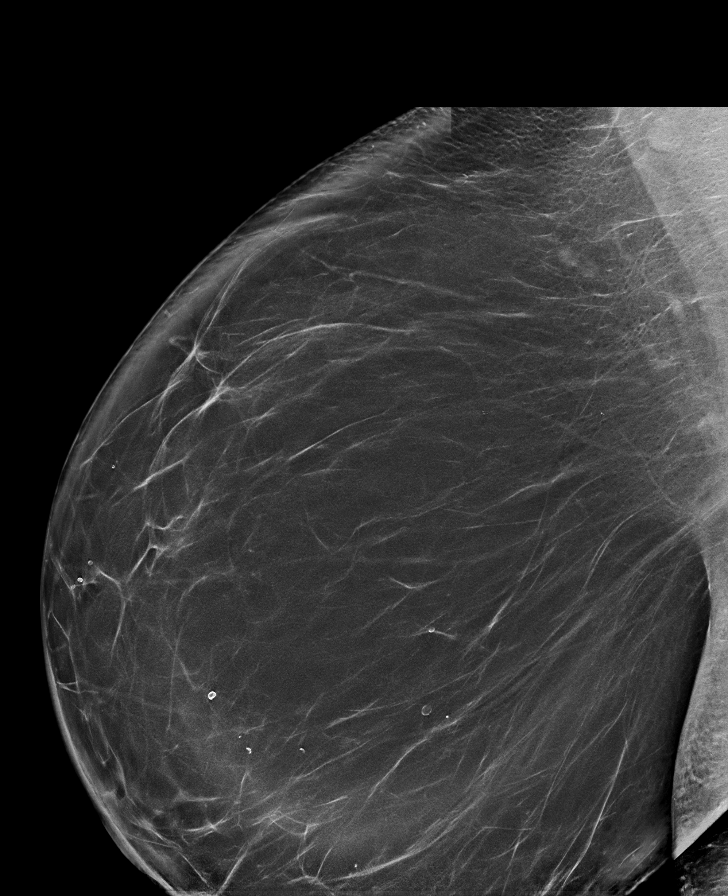

[L MLO synth-2D (2 of 2)]
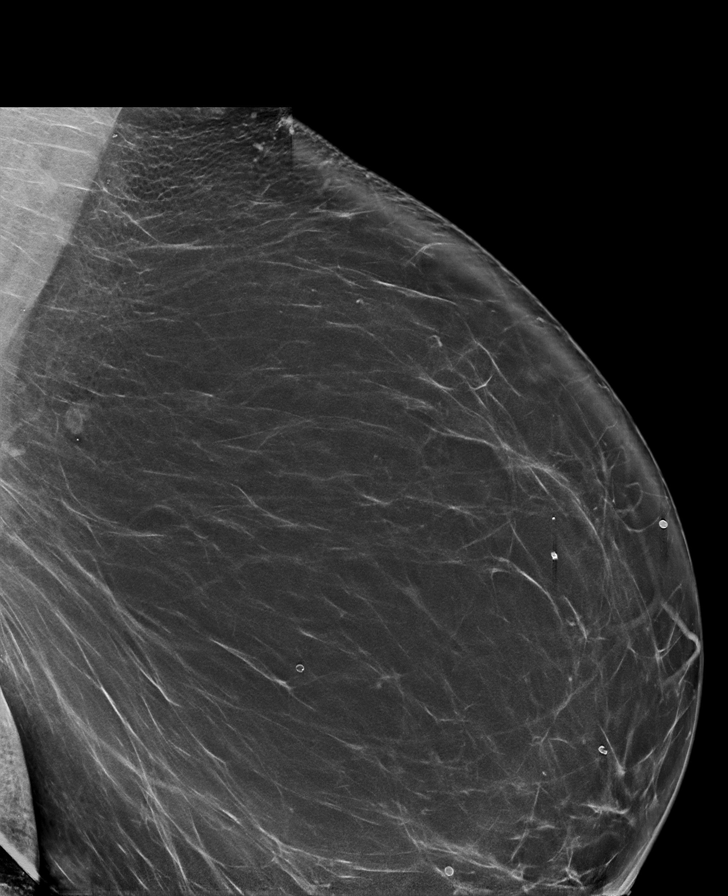

[L CC tomo · tomo slice 47/93.0]
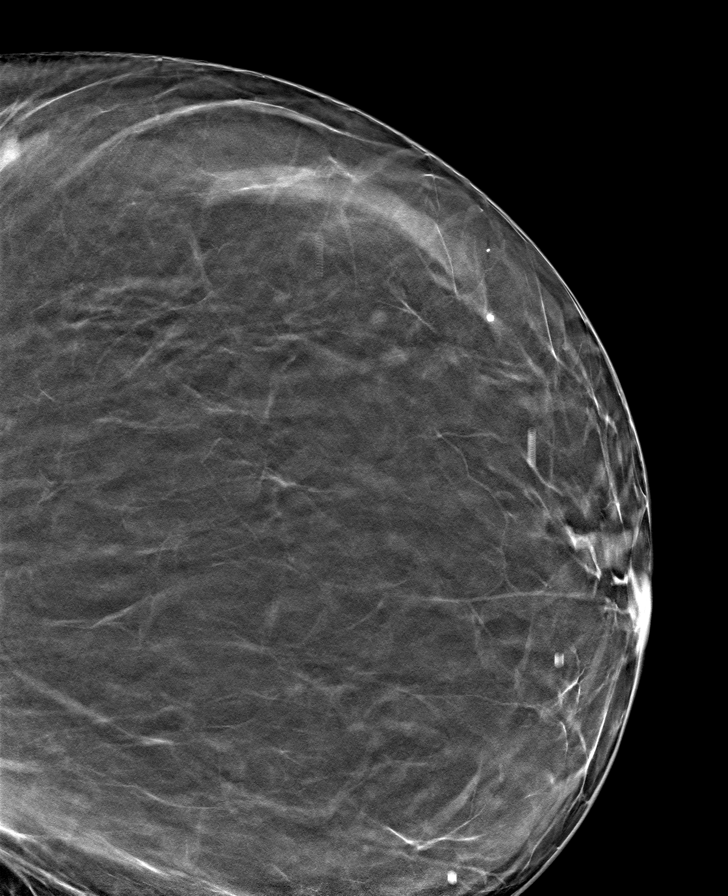

[8 of 40 positions shown; findings below may reference images not displayed]

FINDINGS: There are no findings suspicious for malignancy.
IMPRESSION: No mammographic evidence of malignancy. A result letter of this
screening mammogram will be mailed directly to the patient.

RECOMMENDATION:
Screening mammogram in one year. (Code:0E-3-N98)

BI-RADS CATEGORY  1: Negative.
